# Patient Record
Sex: Female | Born: 1970 | Race: White | Hispanic: No | Smoking: Never smoker
Health system: Southern US, Community
[De-identification: ages and names within clinical notes are randomized; demographics above are authoritative.]

## PROBLEM LIST (undated history)

## (undated) DIAGNOSIS — E785 Hyperlipidemia, unspecified: Secondary | ICD-10-CM

---

## 2011-06-07 ENCOUNTER — Emergency Department: Payer: Self-pay | Admitting: Emergency Medicine

## 2012-09-09 IMAGING — US US PELV - US TRANSVAGINAL
1 series · 17 of 25 positions shown · non-contrast
Comparison: none

REASON FOR EXAM: heavy vaginal bleeding, pelvic pain
COMMENTS:   LMP: 05/26/11

PROCEDURE:     US  - US PELVIS EXAM W/TRANSVAGINAL  - June 08, 2011 [DATE]
RESULT:     Comparison: None.
TECHNIQUE: Multiple grayscale and color Doppler images obtained of the
pelvis via transabdominal and endovaginal ultrasound.

[Series 1: us pelv - us transvaginal · 17 of 25 slices shown]
[im 1/25]
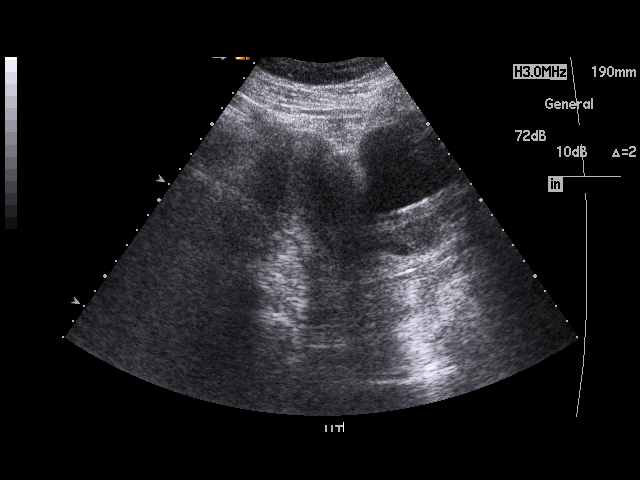
[im 3/25]
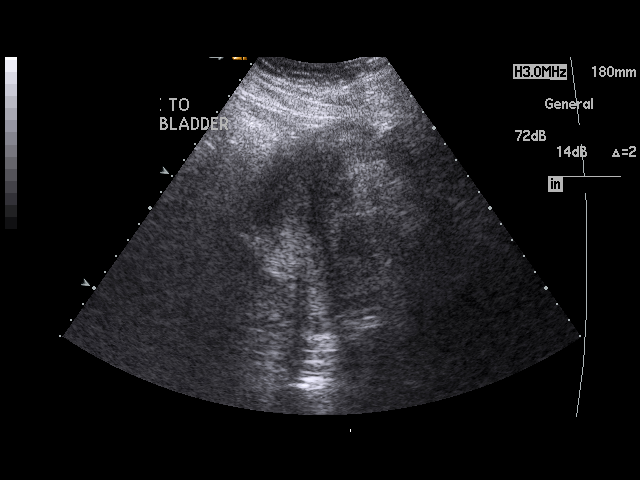
[im 4/25]
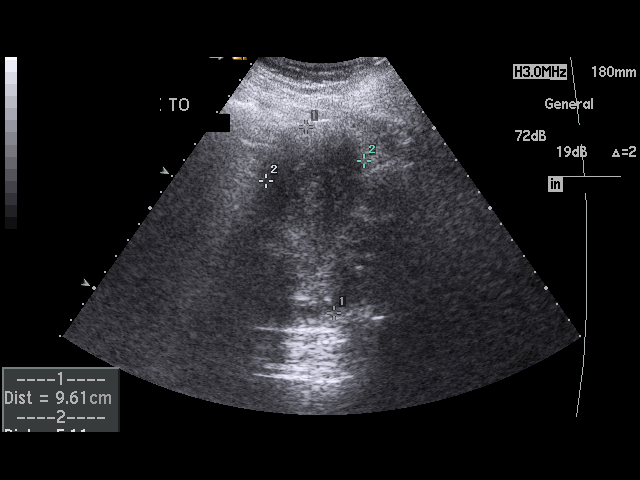
[im 6/25]
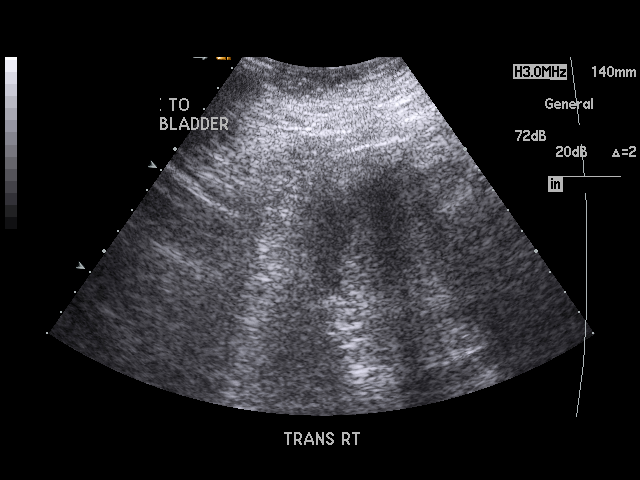
[im 7/25]
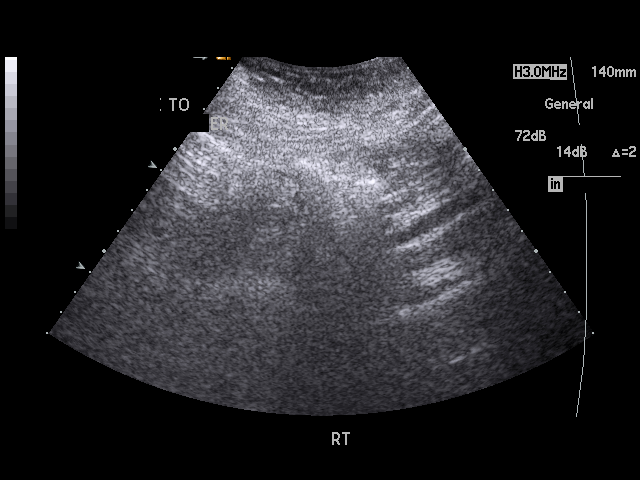
[im 9/25]
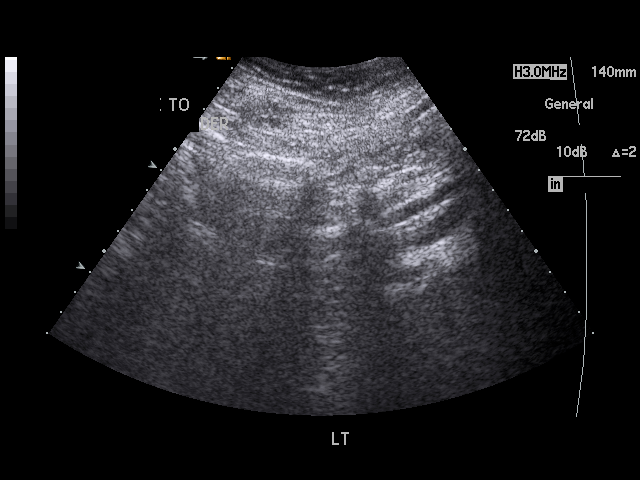
[im 10/25]
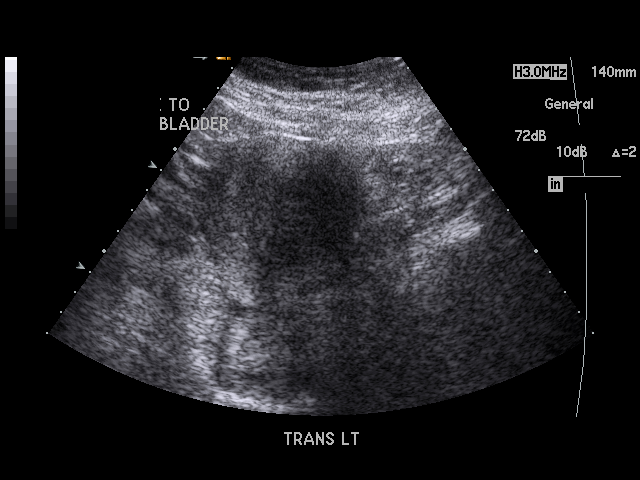
[im 12/25]
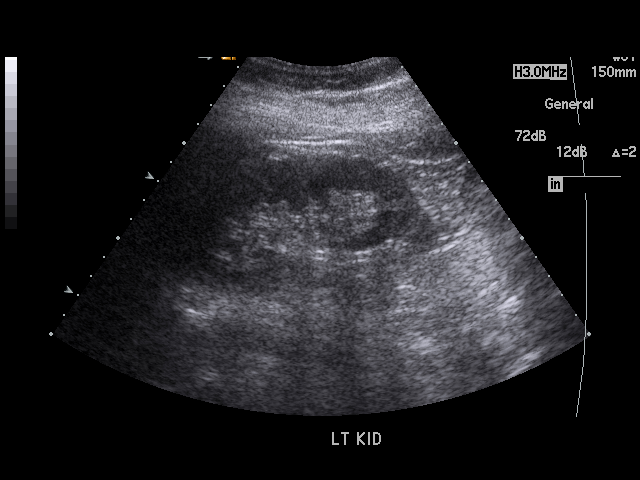
[im 13/25]
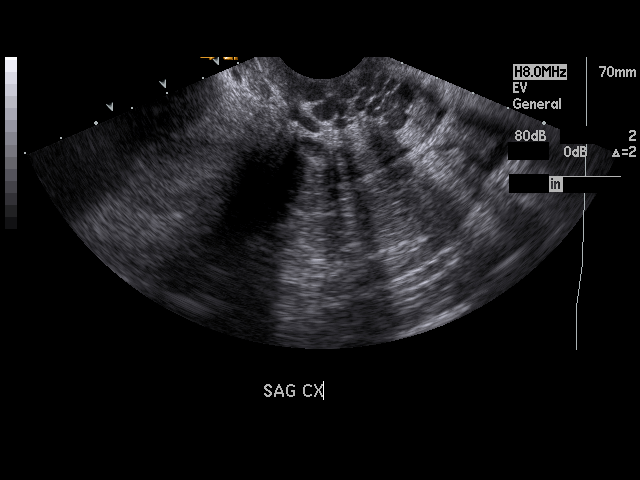
[im 14/25]
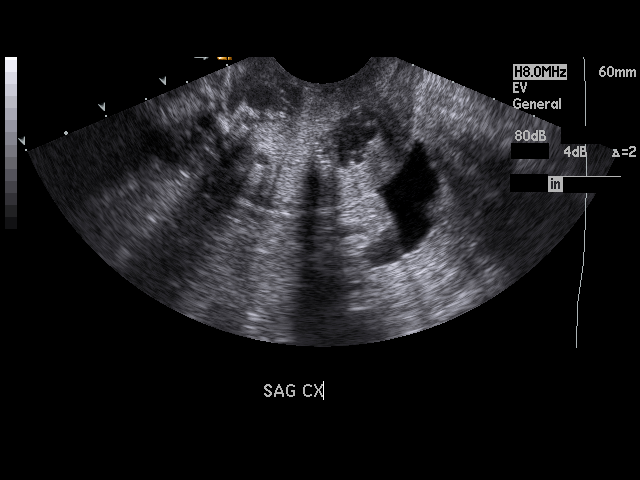
[im 16/25]
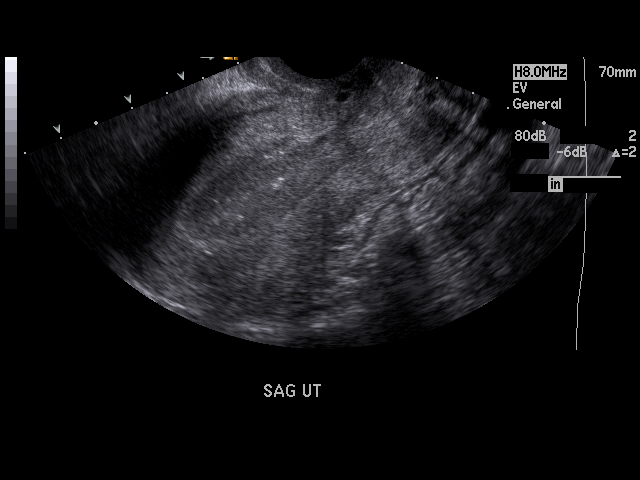
[im 17/25]
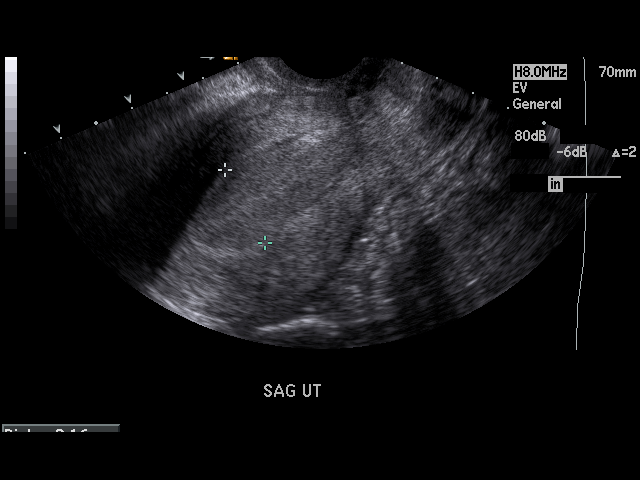
[im 19/25]
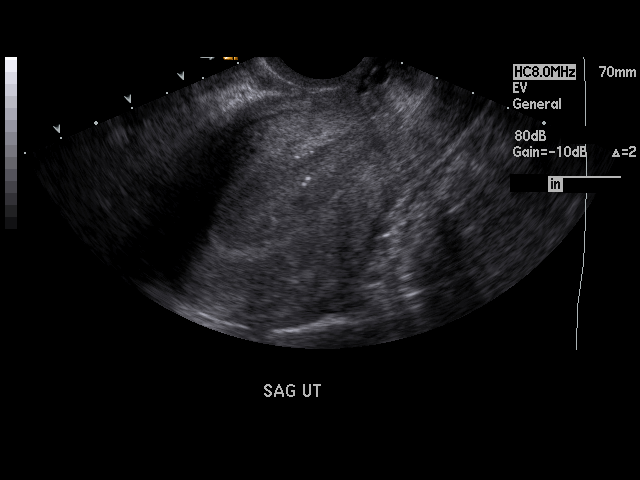
[im 20/25]
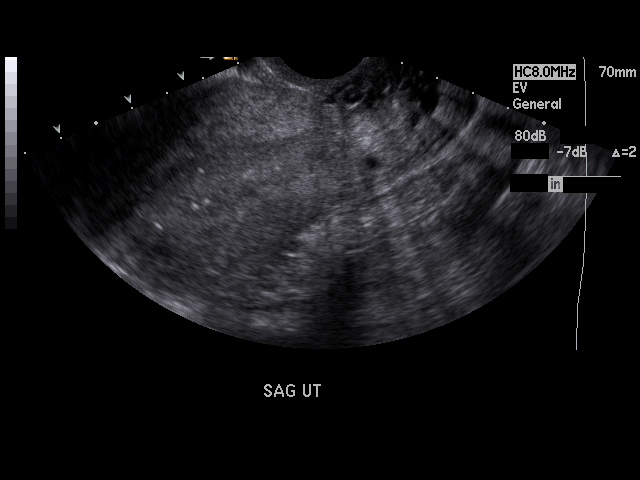
[im 22/25]
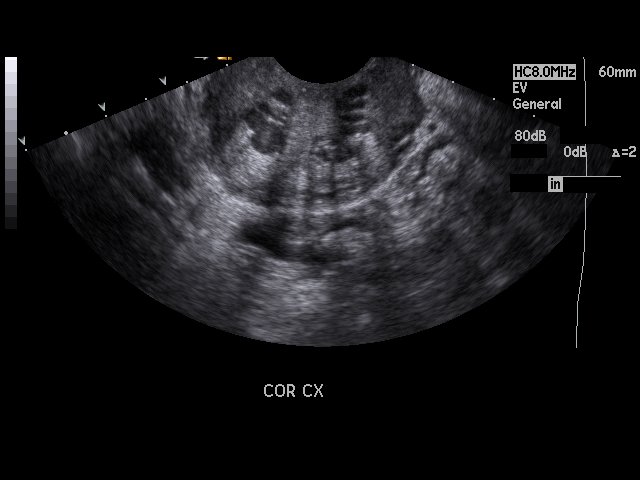
[im 23/25]
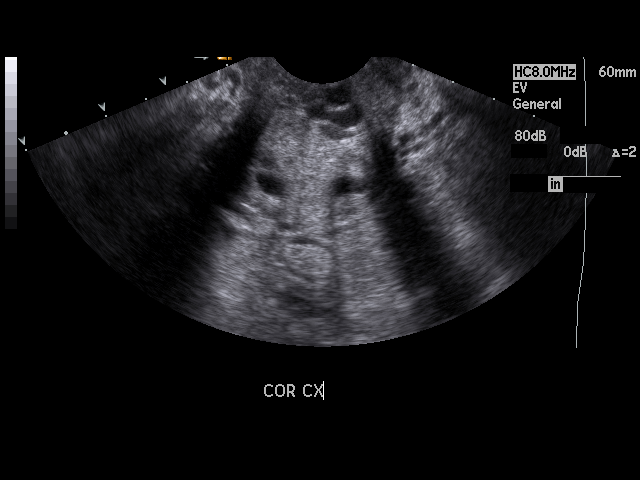
[im 25/25]
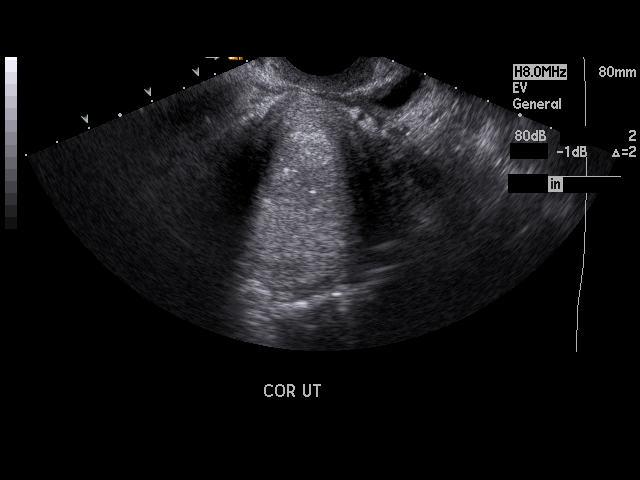

[17 of 25 positions shown; findings below may reference images not displayed]

FINDINGS: The uterus measures 9.6 x 6.4 x 5.1 cm. The endometrial stripe measures 22
mm. There are multiple nabothian cysts. Many of which contain debris. A
small amount of fluid was present in the cul-de-sac.

The ovaries were unable to be visualized.

Examination is limited as patient was unable to tolerate bladder filling and
was unable to tolerate a complete endovaginal examination.
IMPRESSION: 1. Limited examination. Thickened endometrial stripe.
2. Numerous nabothian cysts, many of which contain debris.

## 2019-12-15 ENCOUNTER — Emergency Department
Admission: EM | Admit: 2019-12-15 | Discharge: 2019-12-15 | Disposition: A | Discharge status: Home / Self Care | Payer: Managed Care, Other (non HMO) | Source: Home / Self Care | Attending: Family Medicine | Admitting: Family Medicine

## 2019-12-15 ENCOUNTER — Encounter: Payer: Self-pay | Admitting: Emergency Medicine

## 2019-12-15 ENCOUNTER — Emergency Department (INDEPENDENT_AMBULATORY_CARE_PROVIDER_SITE_OTHER): Payer: Managed Care, Other (non HMO)

## 2019-12-15 ENCOUNTER — Other Ambulatory Visit: Payer: Self-pay

## 2019-12-15 DIAGNOSIS — R0989 Other specified symptoms and signs involving the circulatory and respiratory systems: Secondary | ICD-10-CM

## 2019-12-15 DIAGNOSIS — G9331 Postviral fatigue syndrome: Secondary | ICD-10-CM

## 2019-12-15 DIAGNOSIS — U071 COVID-19: Secondary | ICD-10-CM

## 2019-12-15 DIAGNOSIS — Z8616 Personal history of COVID-19: Secondary | ICD-10-CM

## 2019-12-15 DIAGNOSIS — R0981 Nasal congestion: Secondary | ICD-10-CM

## 2019-12-15 DIAGNOSIS — G933 Postviral fatigue syndrome: Secondary | ICD-10-CM

## 2019-12-15 HISTORY — DX: Hyperlipidemia, unspecified: E78.5

## 2019-12-15 MED ORDER — AMOXICILLIN 875 MG PO TABS: 875.0000 mg | ORAL_TABLET | Freq: Two times a day (BID) | ORAL | 0 refills | Status: AC

## 2019-12-15 NOTE — ED Provider Notes (Signed)
[facility]    CSN: [id number] Arrival date & time: 12/15/19  1718      History   Chief Complaint Chief Complaint  Patient presents with  . Headache    HPI Courtney Fields is a 49 y.o. female.   Patient was diagnosed with a viral URI at an urgent care center on 12/04/19 after 11 days of symptoms.  Her COVID19 test subsequently returned positive.  Her symptoms have generally improved except for persistent frontal headache, sinus congestion, and fatigue.  She denies fevers, chills, and sweats.  She denies cough at this time but does become mildly winded with activity.  She has a rare brief lancinating pain in her left chest.  The history is provided by the patient.    Past Medical History:  Diagnosis Date  . Hyperlipidemia     There are no problems to display for this patient.   History reviewed. No pertinent surgical history.  OB History   No obstetric history on file.      Home Medications    Prior to Admission medications   Medication Sig Start Date End Date Taking? Authorizing Provider  amoxicillin (AMOXIL) 875 MG tablet Take 1 tablet (875 mg total) by mouth 2 (two) times daily. (Rx void after 12/25/19) 12/15/19   Kandra Nicolas, MD    Family History Family History  Problem Relation Age of Onset  . Hypertension Mother   . Hyperlipidemia Mother   . Heart attack Father   . Hypertension Father   . Hyperlipidemia Father   . Cancer Father     Social History Social History   Tobacco Use  . Smoking status: Never Smoker  . Smokeless tobacco: Never Used  Substance Use Topics  . Alcohol use: Yes  . Drug use: Not on file     Allergies   Prednisone   Review of Systems Review of Systems No sore throat + cough, resolved No pleuritic pain, but has occasional brief sharp left chest pain No wheezing + nasal congestion + post-nasal drainage + sinus pain/pressure No itchy/red eyes No earache No hemoptysis No SOB, but feels winded with  activity No fever/chills No nausea No vomiting No abdominal pain No diarrhea No urinary symptoms No skin rash + fatigue No myalgias + headache    Physical Exam Triage Vital Signs ED Triage Vitals [12/15/19 1739]  Enc Vitals Group     BP (!) 141/99     Pulse Rate 81     Resp      Temp 98.7 F (37.1 C)     Temp Source Oral     SpO2 99 %     Weight 133 lb (60.3 kg)     Height 5\' 2"  (1.575 m)     Head Circumference      Peak Flow      Pain Score 0     Pain Loc      Pain Edu?      Excl. in [place]?    No data found.  Updated Vital Signs BP (!) 141/99 (BP Location: Right Arm)   Pulse 81   Temp 98.7 F (37.1 C) (Oral)   Ht 5\' 2"  (1.575 m)   Wt 60.3 kg   SpO2 99%   BMI 24.33 kg/m   Visual Acuity Right Eye Distance:   Left Eye Distance:   Bilateral Distance:    Right Eye Near:   Left Eye Near:    Bilateral Near:     Physical Exam Nursing  notes and Vital Signs reviewed. Appearance:  Patient appears stated age, and in no acute distress Eyes:  Pupils are equal, round, and reactive to light and accomodation.  Extraocular movement is intact.  Conjunctivae are not inflamed  Ears:  Canals normal.  Tympanic membranes normal.  Nose:  Mildly congested turbinates.  No sinus tenderness.  Pharynx:  Normal Neck:  Supple.  Bilateral nodes mildly enlarged, tender to palpation on the left.   Lungs:  Clear to auscultation.  Breath sounds are equal.  Moving air well. Chest:  No tenderness to palpation over sternum. Heart:  Regular rate and rhythm without murmurs, rubs, or gallops.  Abdomen:  Nontender without masses or hepatosplenomegaly.  Bowel sounds are present.  No CVA or flank tenderness.  Extremities:  No edema.  Skin:  No rash present.   UC Treatments / Results  Labs (all labs ordered are listed, but only abnormal results are displayed) Labs Reviewed - No data to display  EKG   Radiology DG Sinuses Complete  Result Date: 12/15/2019 CLINICAL DATA:  Recent  COVID-19 diagnosis, persistent nasal congestion and facial pressure EXAM: PARANASAL SINUSES - COMPLETE 3 + VIEW COMPARISON:  None. FINDINGS: Frontal, Waters, and lateral views of the paranasal sinuses are obtained. Nasal septum is midline. The sinuses are clear. No gas fluid levels. No destructive bony lesions. IMPRESSION: 1. Unremarkable paranasal sinuses. Electronically Signed   By: Sharlet Salina M.D.   On: 12/15/2019 18:41    Procedures Procedures (including critical care time)  Medications Ordered in UC Medications - No data to display  Initial Impression / Assessment and Plan / UC Course  I have reviewed the triage vital signs and the nursing notes.  Pertinent labs & imaging results that were available during my care of the patient were reviewed by me and considered in my medical decision making (see chart for details).    Negative sinus films reassuring.  There is no evidence of bacterial infection today.   Treat symptomatically for now. Followup with Family Doctor if not improved in about 2 weeks.   Final Clinical Impressions(s) / UC Diagnoses   Final diagnoses:  Personal history of covid-19  Sinus congestion  Postviral fatigue syndrome     Discharge Instructions     Try taking Pseudoephedrine (30mg , one or two every 4 to 6 hours) for sinus congestion.  Get adequate rest.   May use Afrin nasal spray (or generic oxymetazoline) each morning for about 5 days and then discontinue.  Also recommend using saline nasal spray several times daily and saline nasal irrigation (AYR is a common brand).  Use Flonase nasal spray each morning after using Afrin nasal spray and saline nasal irrigation. Begin amoxicillin if increasing nasal congestion and fever develop (given an Rx to hold). May take Ibuprofen 200mg , 4 tabs every 8 hours with food for headache.      ED Prescriptions    Medication Sig Dispense Auth. Provider   amoxicillin (AMOXIL) 875 MG tablet Take 1 tablet (875 mg total)  by mouth 2 (two) times daily. (Rx void after 12/25/19) 14 tablet , MD        12/27/19, MD 12/15/19 [id number]

## 2019-12-15 NOTE — Discharge Instructions (Addendum)
Try taking Pseudoephedrine (30mg , one or two every 4 to 6 hours) for sinus congestion.  Get adequate rest.   May use Afrin nasal spray (or generic oxymetazoline) each morning for about 5 days and then discontinue.  Also recommend using saline nasal spray several times daily and saline nasal irrigation (AYR is a common brand).  Use Flonase nasal spray each morning after using Afrin nasal spray and saline nasal irrigation. Begin amoxicillin if increasing nasal congestion and fever develop. May take Ibuprofen 200mg , 4 tabs every 8 hours with food for headache.

## 2019-12-15 NOTE — ED Triage Notes (Signed)
Headache, congestion, fatigue contiues with POSITIVE COVID ON Feb 1.

## 2021-03-19 IMAGING — DX DG SINUSES COMPLETE 3+V
3 series · 3 of 3 positions shown · non-contrast
Comparison: None.

CLINICAL DATA: Recent 3CYUD-P1 diagnosis, persistent nasal
congestion and facial pressure

EXAM:
PARANASAL SINUSES - COMPLETE 3 + VIEW

[pns waters]
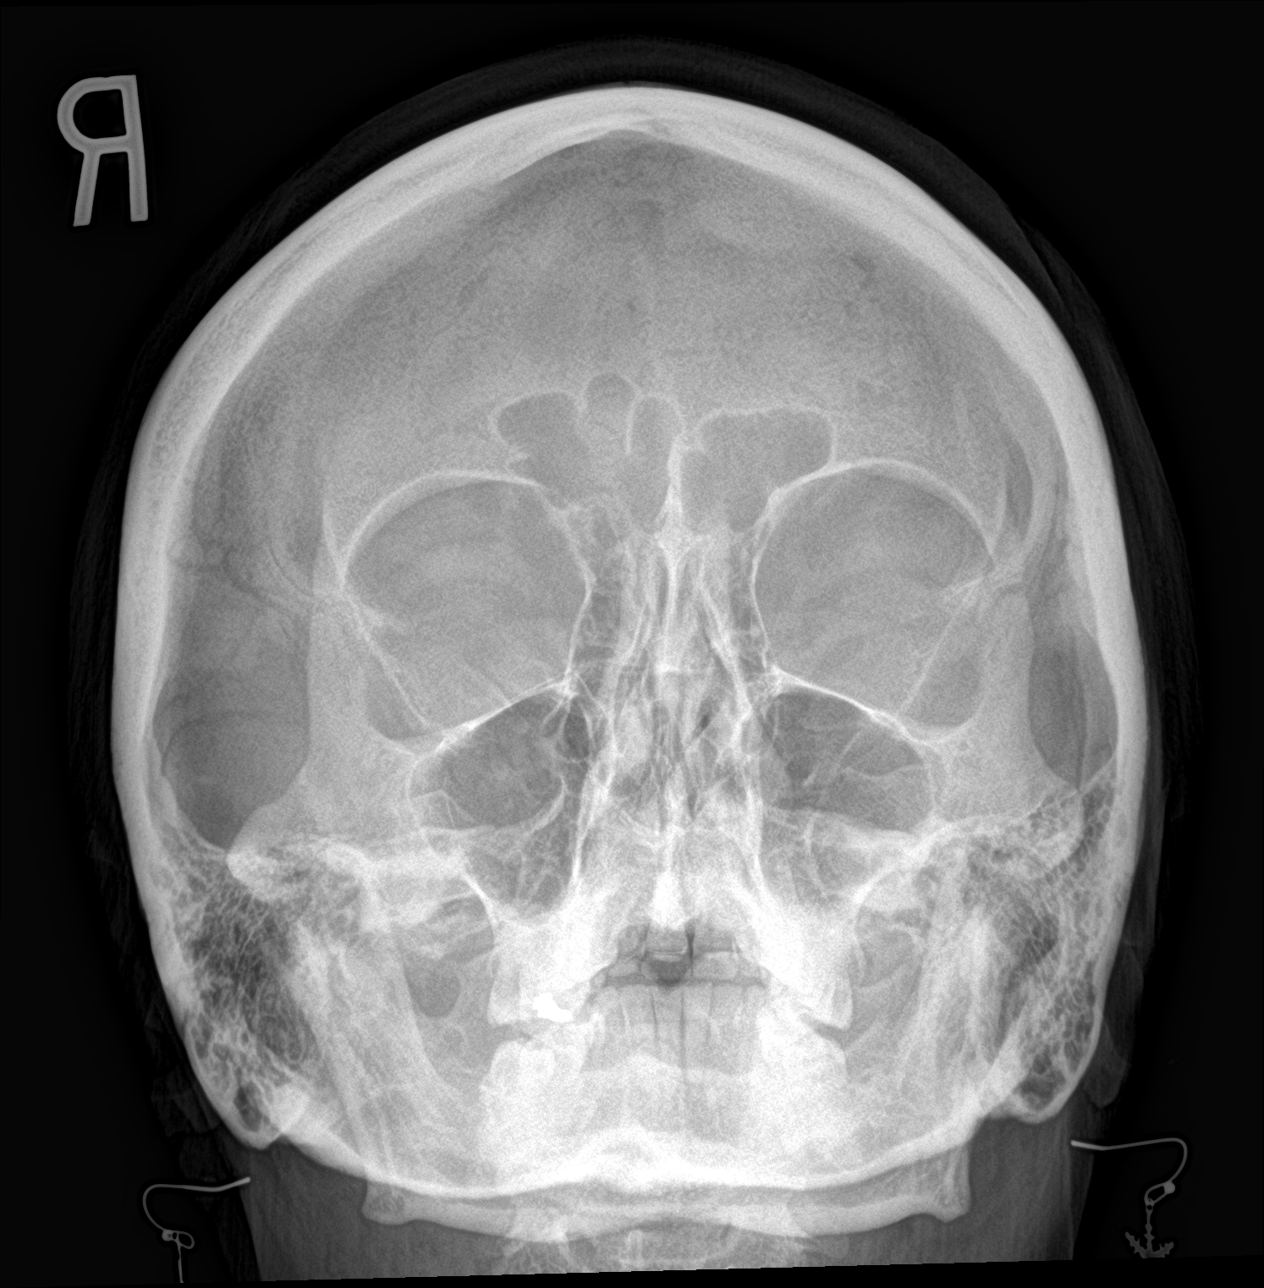

[[person_name]]
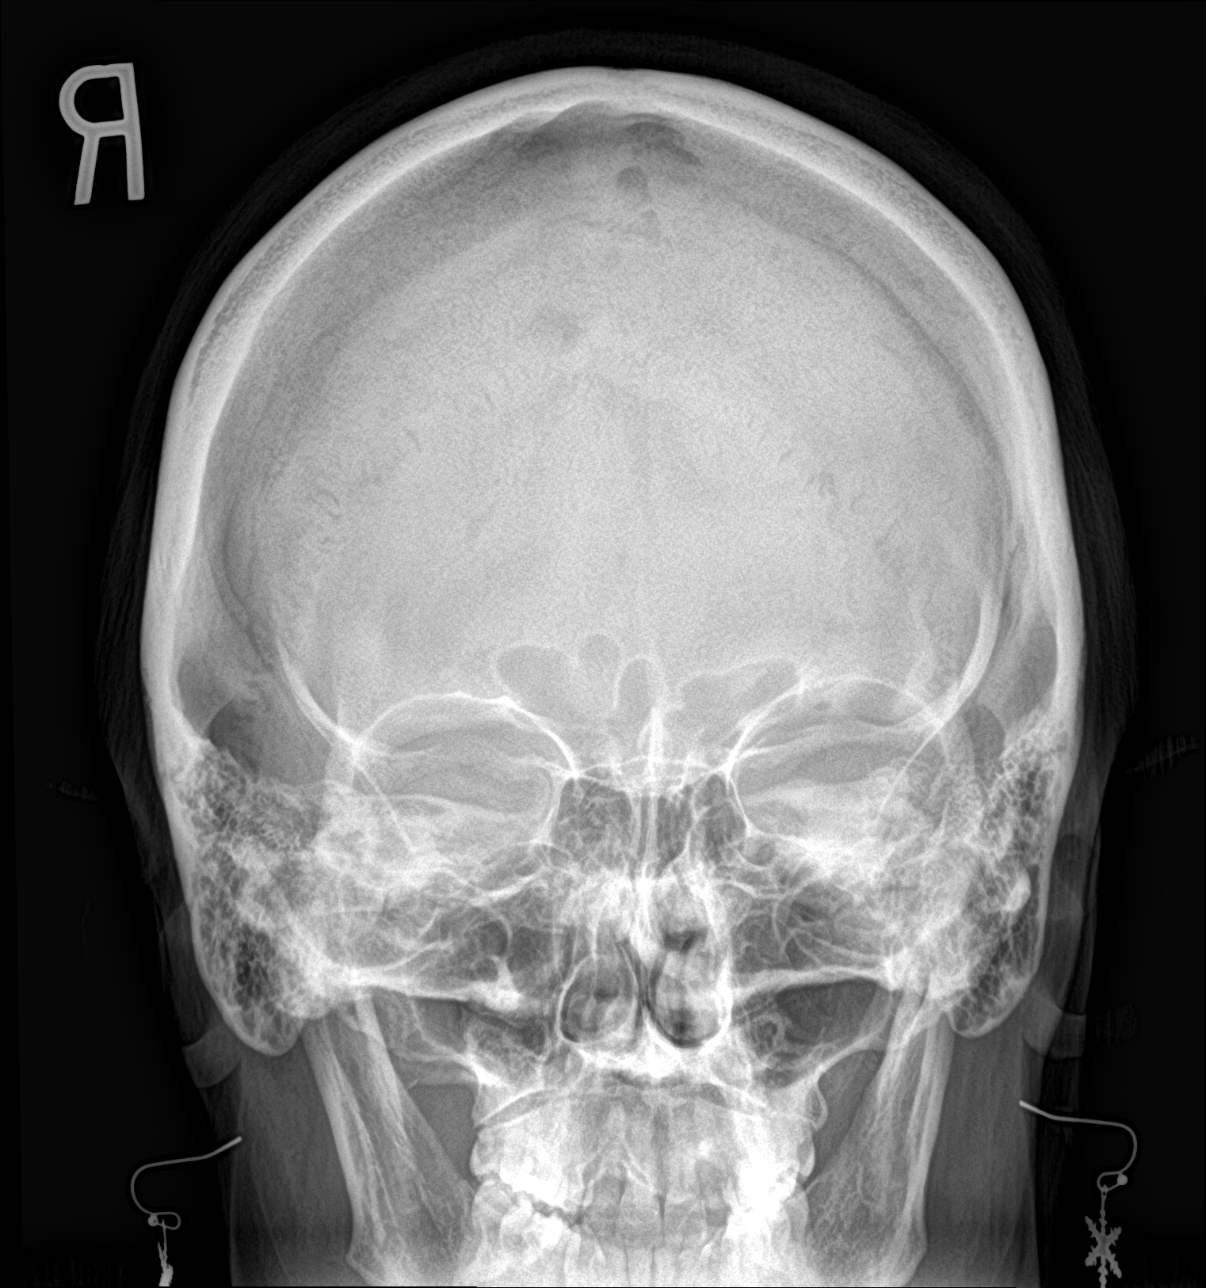

[pns lat]
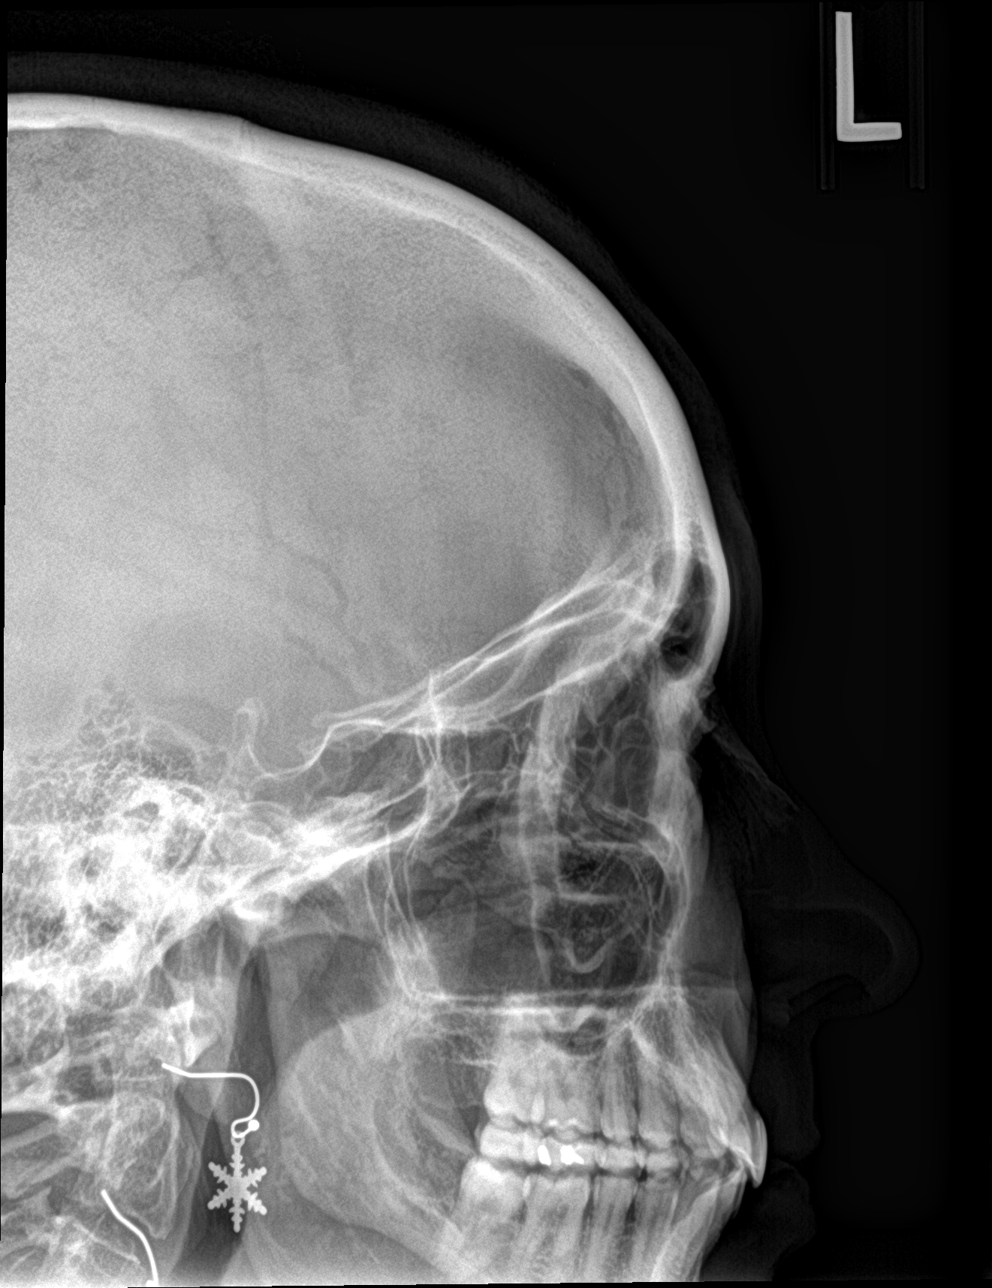

[3 of 3 positions shown; findings below may reference images not displayed]

FINDINGS: Frontal, Waters, and lateral views of the paranasal sinuses are
obtained. Nasal septum is midline. The sinuses are clear. No gas
fluid levels. No destructive bony lesions.
IMPRESSION: 1. Unremarkable paranasal sinuses.
